# Patient Record
Sex: Female | Born: 1985 | Race: White | Hispanic: No | Marital: Single | Smoking: Current some day smoker
Health system: Southern US, Community
[De-identification: ages and names within clinical notes are randomized; demographics above are authoritative.]

---

## 2007-02-22 ENCOUNTER — Ambulatory Visit: Payer: Self-pay | Admitting: Internal Medicine

## 2010-05-14 ENCOUNTER — Ambulatory Visit: Payer: Self-pay | Admitting: Internal Medicine

## 2012-03-02 ENCOUNTER — Ambulatory Visit: Payer: Self-pay | Admitting: Emergency Medicine

## 2012-03-02 LAB — RAPID INFLUENZA A&B ANTIGENS

## 2012-05-19 ENCOUNTER — Ambulatory Visit: Payer: Self-pay | Admitting: Family Medicine

## 2012-05-19 LAB — MONONUCLEOSIS SCREEN: Mono Test: NEGATIVE

## 2013-03-28 ENCOUNTER — Emergency Department: Payer: Self-pay | Admitting: Emergency Medicine

## 2013-03-28 LAB — URINALYSIS, COMPLETE
Bilirubin,UR: NEGATIVE
Blood: NEGATIVE
Glucose,UR: NEGATIVE mg/dL (ref 0–75)
Ketone: NEGATIVE
LEUKOCYTE ESTERASE: NEGATIVE
NITRITE: NEGATIVE
PROTEIN: NEGATIVE
Ph: 5 (ref 4.5–8.0)
Specific Gravity: 1.03 (ref 1.003–1.030)
WBC UR: 2 /HPF (ref 0–5)

## 2013-03-28 LAB — COMPREHENSIVE METABOLIC PANEL
Albumin: 4.1 g/dL (ref 3.4–5.0)
Alkaline Phosphatase: 97 U/L
Anion Gap: 3 — ABNORMAL LOW (ref 7–16)
BILIRUBIN TOTAL: 0.4 mg/dL (ref 0.2–1.0)
BUN: 10 mg/dL (ref 7–18)
CHLORIDE: 110 mmol/L — AB (ref 98–107)
Calcium, Total: 8.8 mg/dL (ref 8.5–10.1)
Co2: 25 mmol/L (ref 21–32)
Creatinine: 0.98 mg/dL (ref 0.60–1.30)
EGFR (Non-African Amer.): >60
GLUCOSE: 90 mg/dL (ref 65–99)
Osmolality: 274 (ref 275–301)
Potassium: 4.4 mmol/L (ref 3.5–5.1)
SGOT(AST): 47 U/L — ABNORMAL HIGH (ref 15–37)
SGPT (ALT): 82 U/L — ABNORMAL HIGH (ref 12–78)
Sodium: 138 mmol/L (ref 136–145)
TOTAL PROTEIN: 7.7 g/dL (ref 6.4–8.2)

## 2013-03-28 LAB — CBC WITH DIFFERENTIAL/PLATELET
BASOS ABS: 0.1 10*3/uL (ref 0.0–0.1)
BASOS PCT: 1 %
EOS ABS: 0.4 10*3/uL (ref 0.0–0.7)
EOS PCT: 2.7 %
HCT: 48.1 % — ABNORMAL HIGH (ref 35.0–47.0)
HGB: 15.4 g/dL (ref 12.0–16.0)
Lymphocyte #: 3.8 10*3/uL — ABNORMAL HIGH (ref 1.0–3.6)
Lymphocyte %: 27.1 %
MCH: 28.7 pg (ref 26.0–34.0)
MCHC: 32 g/dL (ref 32.0–36.0)
MCV: 90 fL (ref 80–100)
Monocyte #: 0.9 x10 3/mm (ref 0.2–0.9)
Monocyte %: 6.4 %
NEUTROS PCT: 62.8 %
Neutrophil #: 8.7 10*3/uL — ABNORMAL HIGH (ref 1.4–6.5)
PLATELETS: 259 10*3/uL (ref 150–440)
RBC: 5.37 10*6/uL — AB (ref 3.80–5.20)
RDW: 14.2 % (ref 11.5–14.5)
WBC: 13.9 10*3/uL — AB (ref 3.6–11.0)

## 2013-03-28 LAB — LIPASE, BLOOD: Lipase: 140 U/L (ref 73–393)

## 2013-03-30 LAB — URINE CULTURE

## 2015-03-23 ENCOUNTER — Emergency Department
Admission: EM | Admit: 2015-03-23 | Discharge: 2015-03-23 | Disposition: A | Discharge status: Home / Self Care | Payer: Managed Care, Other (non HMO) | Attending: Emergency Medicine | Admitting: Emergency Medicine

## 2015-03-23 ENCOUNTER — Encounter: Payer: Self-pay | Admitting: Emergency Medicine

## 2015-03-23 ENCOUNTER — Emergency Department: Payer: Managed Care, Other (non HMO)

## 2015-03-23 DIAGNOSIS — N23 Unspecified renal colic: Secondary | ICD-10-CM | POA: Diagnosis not present

## 2015-03-23 DIAGNOSIS — Z3202 Encounter for pregnancy test, result negative: Secondary | ICD-10-CM | POA: Diagnosis not present

## 2015-03-23 DIAGNOSIS — F17200 Nicotine dependence, unspecified, uncomplicated: Secondary | ICD-10-CM | POA: Insufficient documentation

## 2015-03-23 DIAGNOSIS — R109 Unspecified abdominal pain: Secondary | ICD-10-CM

## 2015-03-23 LAB — COMPREHENSIVE METABOLIC PANEL
ALBUMIN: 4.6 g/dL (ref 3.5–5.0)
ALK PHOS: 75 U/L (ref 38–126)
ALT: 29 U/L (ref 14–54)
ANION GAP: 7 (ref 5–15)
AST: 21 U/L (ref 15–41)
BUN: 16 mg/dL (ref 6–20)
CALCIUM: 9.1 mg/dL (ref 8.9–10.3)
CO2: 22 mmol/L (ref 22–32)
CREATININE: 0.77 mg/dL (ref 0.44–1.00)
Chloride: 107 mmol/L (ref 101–111)
GFR calc Af Amer: >60 mL/min (ref >60)
GFR calc non Af Amer: >60 mL/min (ref >60)
GLUCOSE: 121 mg/dL — AB (ref 65–99)
Potassium: 4.3 mmol/L (ref 3.5–5.1)
SODIUM: 136 mmol/L (ref 135–145)
Total Bilirubin: 0.5 mg/dL (ref 0.3–1.2)
Total Protein: 7.4 g/dL (ref 6.5–8.1)

## 2015-03-23 LAB — CBC
HCT: 44.5 % (ref 35.0–47.0)
HEMOGLOBIN: 15.2 g/dL (ref 12.0–16.0)
MCH: 29.9 pg (ref 26.0–34.0)
MCHC: 34.2 g/dL (ref 32.0–36.0)
MCV: 87.3 fL (ref 80.0–100.0)
Platelets: 231 10*3/uL (ref 150–440)
RBC: 5.1 MIL/uL (ref 3.80–5.20)
RDW: 13.5 % (ref 11.5–14.5)
WBC: 16.5 10*3/uL — ABNORMAL HIGH (ref 3.6–11.0)

## 2015-03-23 LAB — URINALYSIS COMPLETE WITH MICROSCOPIC (ARMC ONLY)
BILIRUBIN URINE: NEGATIVE
GLUCOSE, UA: NEGATIVE mg/dL
Ketones, ur: NEGATIVE mg/dL
LEUKOCYTES UA: NEGATIVE
Nitrite: NEGATIVE
Protein, ur: NEGATIVE mg/dL
SPECIFIC GRAVITY, URINE: 1.012 (ref 1.005–1.030)
pH: 5 (ref 5.0–8.0)

## 2015-03-23 LAB — PREGNANCY, URINE: PREG TEST UR: NEGATIVE

## 2015-03-23 MED ORDER — HYDROMORPHONE HCL 2 MG PO TABS: 2.0000 mg | ORAL_TABLET | Freq: Two times a day (BID) | ORAL | Status: AC | PRN

## 2015-03-23 MED ORDER — ONDANSETRON HCL 4 MG PO TABS: 4.0000 mg | ORAL_TABLET | Freq: Three times a day (TID) | ORAL | Status: AC | PRN

## 2015-03-23 MED ORDER — ONDANSETRON HCL 4 MG/2ML IJ SOLN
4.0000 mg | Freq: Once | INTRAMUSCULAR | Status: AC | PRN
  Administered 2015-03-23: 4 mg via INTRAVENOUS

## 2015-03-23 MED ORDER — ONDANSETRON HCL 4 MG/2ML IJ SOLN
4.0000 mg | Freq: Once | INTRAMUSCULAR | Status: AC
  Administered 2015-03-23: 4 mg via INTRAVENOUS
  Filled 2015-03-23: qty 2

## 2015-03-23 MED ORDER — HYDROMORPHONE HCL 1 MG/ML IJ SOLN
1.0000 mg | Freq: Once | INTRAMUSCULAR | Status: AC
  Administered 2015-03-23: 1 mg via INTRAVENOUS
  Filled 2015-03-23: qty 1

## 2015-03-23 MED ORDER — KETOROLAC TROMETHAMINE 30 MG/ML IJ SOLN
30.0000 mg | Freq: Once | INTRAMUSCULAR | Status: AC
  Administered 2015-03-23: 30 mg via INTRAVENOUS
  Filled 2015-03-23: qty 1

## 2015-03-23 MED ORDER — FENTANYL CITRATE (PF) 100 MCG/2ML IJ SOLN
50.0000 ug | Freq: Once | INTRAMUSCULAR | Status: AC
  Administered 2015-03-23: 50 ug via INTRAVENOUS

## 2015-03-23 NOTE — ED Notes (Signed)
Developed some right lower abd pain last pm  Then pain eased off ..but returned about 3 am. States pain has radiated slightly into flank area  Pos n/v  Last time vomited was prior to arrival .

## 2015-03-23 NOTE — ED Provider Notes (Signed)
Time Seen: Approximately 0 9:15 I have reviewed the triage notes  Chief Complaint: Abdominal Pain   History of Present Illness: Linda Farmer is a 30 y.o. female who presents with acute onset of right-sided flank and abdominal pain that started last night. Patient states the pain seemed to resolve and then return again approximately 3 AM. Patient states that she has a history of renal colic and this feels similar. She has had some nausea vomiting multiple times with no blood or bile. She denies any loose stool or diarrhea. Patient denies any fever or chills or productive cough. She denies any melena or hematochezia. She states that there is no chance that she is pregnant. She denies any dysuria, hematuria, urinary frequency   History reviewed. No pertinent past medical history.  There are no active problems to display for this patient.   History reviewed. No pertinent past surgical history.  History reviewed. No pertinent past surgical history.  No current outpatient prescriptions on file.  Allergies:  Review of patient's allergies indicates no known allergies.  Family History: History reviewed. No pertinent family history.  Social History: Social History  Substance Use Topics  . Smoking status: Current Some Day Smoker  . Smokeless tobacco: None  . Alcohol Use: Yes     Review of Systems:   10 point review of systems was performed and was otherwise negative:  Constitutional: No fever Eyes: No visual disturbances ENT: No sore throat, ear pain Cardiac: No chest pain Respiratory: No shortness of breath, wheezing, or stridor Abdomen: Mild pain toward the right lower quadrant and groin area with radiation toward the right upper quadrant and flank area. Endocrine: No weight loss, No night sweats Extremities: No peripheral edema, cyanosis Skin: No rashes, easy bruising Neurologic: No focal weakness, trouble with speech or swollowing Urologic: No dysuria, Hematuria, or  urinary frequency No vaginal discharge or bleeding  Physical Exam:  ED Triage Vitals  Enc Vitals Group     BP 03/23/15 0718 130/80 mmHg     Pulse Rate 03/23/15 0718 65     Resp 03/23/15 0718 18     Temp 03/23/15 0718 97.9 F (36.6 C)     Temp Source 03/23/15 0718 Oral     SpO2 03/23/15 0718 97 %     Weight 03/23/15 0718 175 lb (79.379 kg)     Height 03/23/15 0718  (1.753 m)     Head Cir --      Peak Flow --      Pain Score 03/23/15 0719 8     Pain Loc --      Pain Edu? --      Excl. in GC? --     General: Awake , Alert , and Oriented times 3; GCS 15 appears uncomfortable Head: Normal cephalic , atraumatic Eyes: Pupils equal , round, reactive to light Nose/Throat: No nasal drainage, patent upper airway without erythema or exudate.  Neck: Supple, Full range of motion, No anterior adenopathy or palpable thyroid masses Lungs: Clear to ascultation without wheezes , rhonchi, or rales Heart: Regular rate, regular rhythm without murmurs , gallops , or rubs Abdomen: Soft, non tender without rebound, guarding , or rigidity; bowel sounds positive and symmetric in all 4 quadrants. No organomegaly .        Extremities: 2 plus symmetric pulses. No edema, clubbing or cyanosis Neurologic: normal ambulation, Motor symmetric without deficits, sensory intact Skin: warm, dry, no rashes   Labs:   All laboratory work was reviewed  including any pertinent negatives or positives listed below:  Labs Reviewed  URINALYSIS COMPLETEWITH MICROSCOPIC ([facility] ONLY) - Abnormal; Notable for the following:    Color, Urine YELLOW (*)    APPearance HAZY (*)    Hgb urine dipstick 3+ (*)    Bacteria, UA FEW (*)    Squamous Epithelial / LPF 0-5 (*)    All other components within normal limits  COMPREHENSIVE METABOLIC PANEL - Abnormal; Notable for the following:    Glucose, Bld 121 (*)    All other components within normal limits  CBC - Abnormal; Notable for the following:    WBC 16.5 (*)    All other  components within normal limits  POC URINE PREG, ED   review of the patient's laboratory work shows significant hematuria   Radiology:    CLINICAL DATA: Pt with right sided abd/pelvic pain that started yesterday night at 8pm. Pt with N/V/D since. Pt denies hx of surgery. Pt with hx of kidney stones.  EXAM: CT ABDOMEN AND PELVIS WITHOUT CONTRAST  TECHNIQUE: Multidetector CT imaging of the abdomen and pelvis was performed following the standard protocol without IV contrast.  COMPARISON: 03/28/2013  FINDINGS: Lower chest: Dependent atelectasis or patchy infiltrates posteriorly in the visualized lung bases. No pleural or pericardial effusion.  Hepatobiliary: No mass visualized on this un-enhanced exam.  Pancreas: No mass or inflammatory process identified on this un-enhanced exam.  Spleen: Within normal limits in size.  Adrenals/Urinary Tract: Normal adrenal glands. 1 mm calculi in the upper and lower pole left renal collecting systems. There is mild right hydronephrosis and ureterectasis. Inflammatory/ edematous changes around the right kidney and renal collecting system. 2 mm calculus in the distal right ureter just proximal to the ureteral orifice. Urinary bladder physiologically distended.  Stomach/Bowel: No evidence of obstruction, inflammatory process, or abnormal fluid collections. Normal appendix.  Vascular/Lymphatic: No pathologically enlarged lymph nodes. No evidence of abdominal aortic aneurysm. Left pelvic phlebolith.  Reproductive: No mass or other significant abnormality.  Other: Small amount of pelvic fluid, probably physiologic. No free air.  Musculoskeletal: No suspicious bone lesions identified.  IMPRESSION: 1. Obstructing 2 mm distal RIGHT ureteral calculus with mild hydronephrosis. 2. LEFT nephrolithiasis.   I personally reviewed the radiologic studies    ED Course:  Patient's stay here showed symptomatic improvement with IV Toradol,  IV Dilaudid, and IV Zofran. She appears to have by CT evaluation a small kidney stone approximately 2 mm in size which I felt would pass easily. Her differential for right lower quadrant abdominal pain is rather extensive though given her past medical history I felt again this was most likely renal colic  Differential diagnosis includes but is not exclusive to ovarian cyst, ovarian torsion, acute appendicitis, urinary tract infection, endometriosis, bowel obstruction, colitis, renal colic, gastroenteritis, etc.     Assessment: * Acute renal colic   Final Clinical Impression:   Final diagnoses:  Right flank pain     Plan: * Outpatient management Patient was advised to return immediately if condition worsens. Patient was advised to follow up with their primary care physician or other specialized physicians involved in their outpatient care            Jennye Moccasin, MD 03/23/15 1158

## 2015-03-23 NOTE — ED Notes (Signed)
Patient arrives to ED with c/o lower right abdominal/flank pain with decrease in UOP and increase in frequency. +N/V

## 2016-11-30 IMAGING — CT CT RENAL STONE PROTOCOL
1 of 2 series · 5 of 32 positions shown, 10 images · non-contrast
Comparison: 03/28/2013

CLINICAL DATA: Pt with right sided abd/pelvic pain that started
yesterday night at 8pm. Pt with N/V/D since. Pt denies hx of
surgery. Pt with hx of kidney stones.

EXAM:
CT ABDOMEN AND PELVIS WITHOUT CONTRAST
TECHNIQUE: Multidetector CT imaging of the abdomen and pelvis was performed
following the standard protocol without IV contrast.

[Series 4: lung windows · axial · 0.68mm/px · z∈[+234,+319]mm · 5 of 27 slices shown, 10 images]
[im 5/27  soft-tissue]
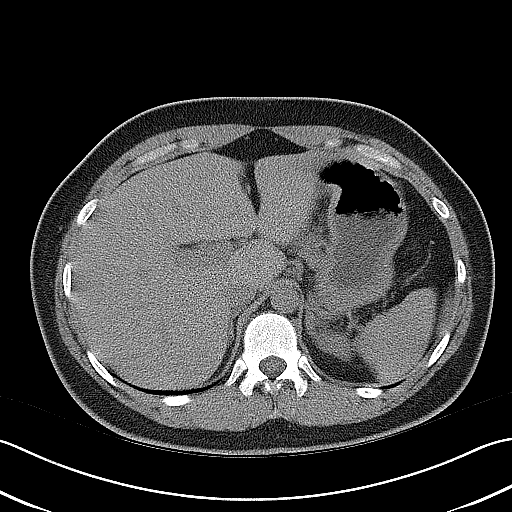
[im 5/27  bone]
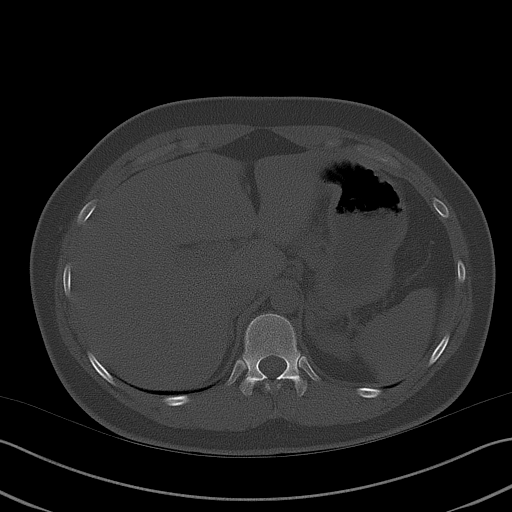
[im 9/27  soft-tissue]
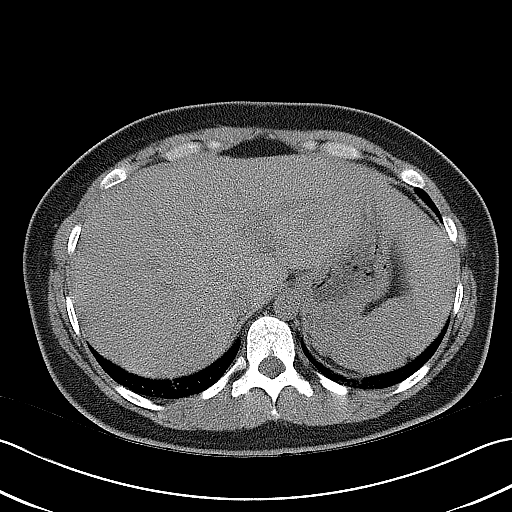
[im 9/27  lung]
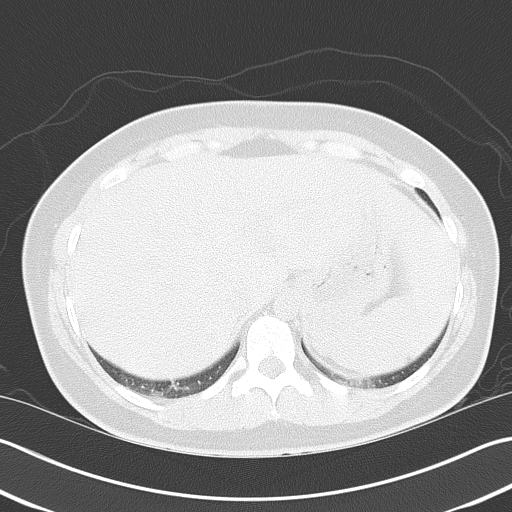
[im 14/27  soft-tissue]
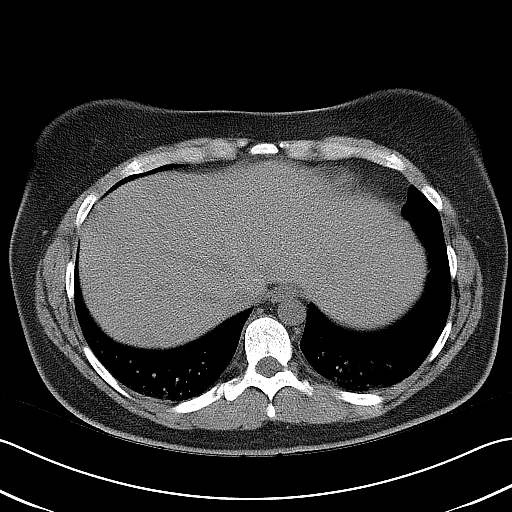
[im 14/27  lung]
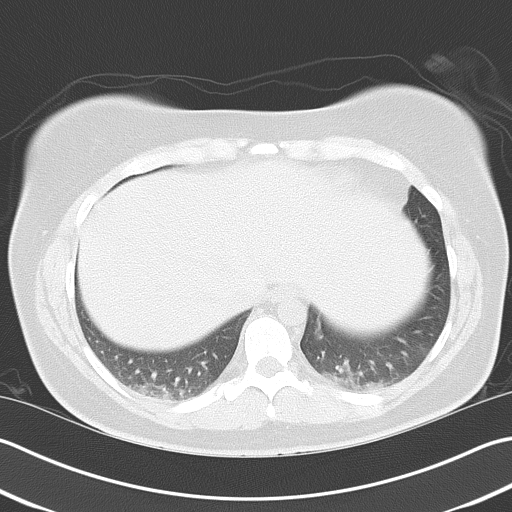
[im 18/27  soft-tissue]
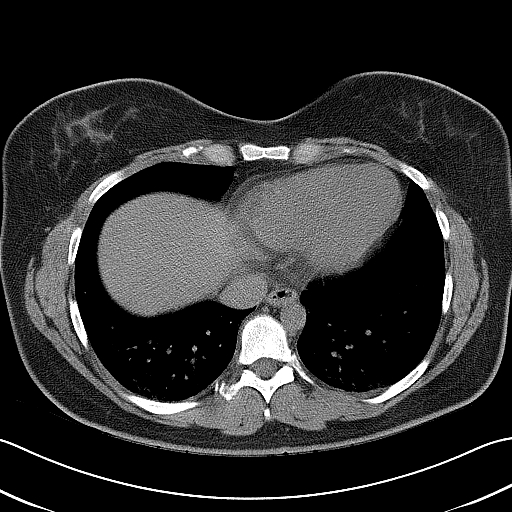
[im 18/27  lung]
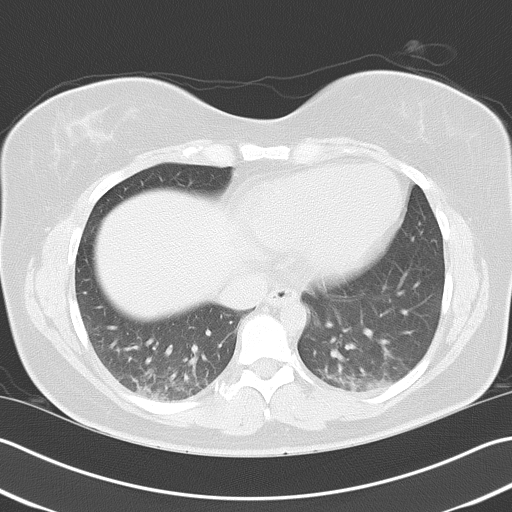
[im 22/27  soft-tissue]
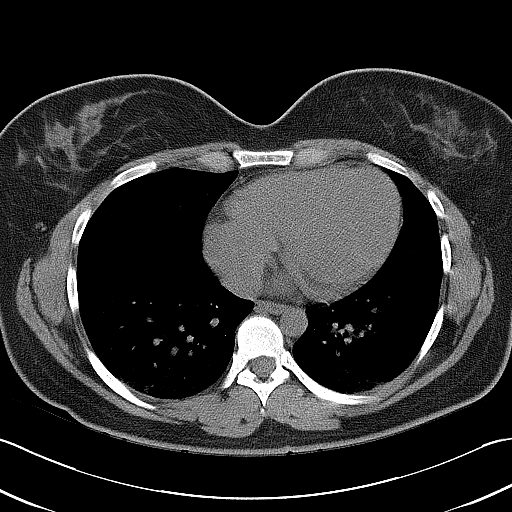
[im 22/27  lung]
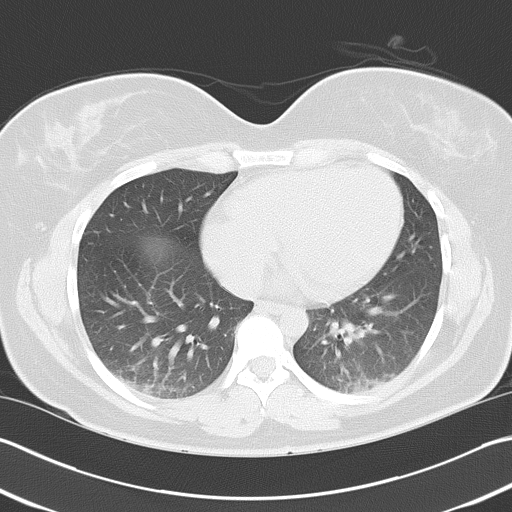

[5 of 32 positions shown; findings below may reference images not displayed]

FINDINGS: Lower chest: Dependent atelectasis or patchy infiltrates posteriorly
in the visualized lung bases. No pleural or pericardial effusion.

Hepatobiliary: No mass visualized on this un-enhanced exam.

Pancreas: No mass or inflammatory process identified on this
un-enhanced exam.

Spleen: Within normal limits in size.

Adrenals/Urinary Tract: Normal adrenal glands. 1 mm calculi in the
upper and lower pole left renal collecting systems. There is mild
right hydronephrosis and ureterectasis. Inflammatory/ edematous
changes around the right kidney and renal collecting system. 2 mm
calculus in the distal right ureter just proximal to the ureteral
orifice. Urinary bladder physiologically distended.

Stomach/Bowel: No evidence of obstruction, inflammatory process, or
abnormal fluid collections. Normal appendix.

Vascular/Lymphatic: No pathologically enlarged lymph nodes. No
evidence of abdominal aortic aneurysm. Left pelvic phlebolith.

Reproductive: No mass or other significant abnormality.

Other: Small amount of pelvic fluid, probably physiologic. No free
air.

Musculoskeletal:  No suspicious bone lesions identified.
IMPRESSION: 1. Obstructing 2 mm distal RIGHT ureteral calculus with mild
hydronephrosis.
2. LEFT nephrolithiasis.
